# Patient Record
Sex: Male | Born: 1990 | Race: Black or African American | Hispanic: No | Marital: Single | Smoking: Never smoker
Health system: Southern US, Community
[De-identification: ages and names within clinical notes are randomized; demographics above are authoritative.]

## PROBLEM LIST (undated history)

## (undated) HISTORY — PX: KNEE SURGERY: SHX244

---
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
## 2008-05-29 ENCOUNTER — Encounter (INDEPENDENT_AMBULATORY_CARE_PROVIDER_SITE_OTHER): Payer: Self-pay | Admitting: Urology

## 2008-05-29 ENCOUNTER — Other Ambulatory Visit: Payer: Self-pay | Admitting: Emergency Medicine

## 2008-05-29 ENCOUNTER — Ambulatory Visit (HOSPITAL_COMMUNITY): Admission: EM | Admit: 2008-05-29 | Discharge: 2008-05-29 | Payer: Self-pay | Admitting: Emergency Medicine

## 2010-09-03 LAB — CBC
HCT: 41.7 % (ref 39.0–52.0)
MCV: 90.3 fL (ref 78.0–100.0)
WBC: 6.1 10*3/uL (ref 4.0–10.5)

## 2010-09-03 LAB — DIFFERENTIAL
Basophils Relative: 0 % (ref 0–1)
Eosinophils Absolute: 0 10*3/uL (ref 0.0–0.7)
Lymphocytes Relative: 24 % (ref 12–46)
Monocytes Relative: 5 % (ref 3–12)

## 2010-09-03 LAB — BASIC METABOLIC PANEL
BUN: 9 mg/dL (ref 6–23)
Calcium: 9.3 mg/dL (ref 8.4–10.5)
Chloride: 105 mEq/L (ref 96–112)
Creatinine, Ser: 0.8 mg/dL (ref 0.4–1.5)
GFR calc Af Amer: >60 mL/min (ref >60)
Potassium: 4.1 mEq/L (ref 3.5–5.1)
Sodium: 139 mEq/L (ref 135–145)

## 2010-10-02 NOTE — Op Note (Signed)
NAMETERRYN, REDNER              ACCOUNT NO.:  [id number]   MEDICAL RECORD NO.:  [id number]          PATIENT TYPE:  EMS   LOCATION:  ED                           FACILITY:  [facility]   PHYSICIAN:  Courtney Paris, M.D.DATE OF BIRTH:  1990/10/16   DATE OF PROCEDURE:  05/29/2008  DATE OF DISCHARGE:  05/29/2008                               OPERATIVE REPORT   PREOPERATIVE DIAGNOSIS:  Paraphimosis.   POSTOPERATIVE DIAGNOSIS:  Paraphimosis.   OPERATION:  Emergency circumcision.   ANESTHESIA:  General.   SURGEON:  Courtney Paris, M.D.   BRIEF HISTORY:  This 20 year old [employer] freshman was admitted with a 16 or  17-hour nonretractable foreskin.  He has paraphimosis attempted to be  reduced at the [facility] but despite IV Versed, local  Xylocaine, ice and several attempts at manual reduction, this was not  possible.  He was sent for evaluation.  He was seen in emergency room  and taken immediately to surgery where an emergency circumcision was to  be done.   The patient was placed on the operating table in supine position.  After  satisfactory induction of general anesthesia, he was prepped and draped  with Betadine in the usual sterile fashion.  IV Ancef was given.  Time-  out was then performed and the patient and procedure were then  reconfirmed.  Even asleep, I could not manually reduce the paraphimosis.  The skin was beginning to crack where the ring was, that was so tight  causing rather massive edema of the glans penis and the foreskin that  would not retract.  A circumferential incision was made around this and  another one around the coronal sulcus and then the redundant skin was  then removed.  There was still some edematous skin that had to be  clamped and ligated with 4-0 chromic ties to effect good hemostasis.  When this was done, the Bovie with a fine-needle electrode effected good  hemostasis.  The edges of the skin were then reapproximated with  interrupted 4-0 chromic catgut suture.  This was done circumferentially.  7 mL of 0.5% Marcaine was injected around the base of penis for  postoperative pain relief.  The glans looked viable and the circumcision  looked good when we finished.  We put a dressing of collodion, Vaseline  gauze, sterile dry bandage and Coban.  The patient was taken recovery  room in good condition to be later discharged as an outpatient.  Have  him come back to the office in a week for follow-up.      Courtney Paris, M.D.  Electronically Signed     HMK/MEDQ  D:  05/29/2008  T:  05/30/2008  Job:  [id number]

## 2011-05-13 ENCOUNTER — Encounter: Payer: Self-pay | Admitting: *Deleted

## 2011-05-13 ENCOUNTER — Emergency Department (HOSPITAL_BASED_OUTPATIENT_CLINIC_OR_DEPARTMENT_OTHER)
Admission: EM | Admit: 2011-05-13 | Discharge: 2011-05-13 | Disposition: A | Discharge status: Home / Self Care | Payer: BC Managed Care – PPO | Attending: Emergency Medicine | Admitting: Emergency Medicine

## 2011-05-13 DIAGNOSIS — W260XXA Contact with knife, initial encounter: Secondary | ICD-10-CM | POA: Insufficient documentation

## 2011-05-13 DIAGNOSIS — S61219A Laceration without foreign body of unspecified finger without damage to nail, initial encounter: Secondary | ICD-10-CM

## 2011-05-13 DIAGNOSIS — S61209A Unspecified open wound of unspecified finger without damage to nail, initial encounter: Secondary | ICD-10-CM | POA: Insufficient documentation

## 2011-05-13 DIAGNOSIS — W261XXA Contact with sword or dagger, initial encounter: Secondary | ICD-10-CM | POA: Insufficient documentation

## 2011-05-13 NOTE — ED Notes (Signed)
Left index finger laceration. Was putting together a plastic toy and cut his self. Bleeding controlled.

## 2011-05-13 NOTE — ED Provider Notes (Signed)
History   This chart was scribed for Cyndra Numbers, MD by Melba Coon. The patient was seen in room MH01/MH01 and the patient's care was started at 8:15PM.    CSN: [id number]  Arrival date & time 05/13/11  [place]   First MD Initiated Contact with Patient 05/13/11 2015      Chief Complaint  Patient presents with  . Extremity Laceration    (Consider location/radiation/quality/duration/timing/severity/associated sxs/prior treatment) HPI  Johnathan Ramirez is a 20 y.o. male who presents to the Emergency Department complaining of moderate to severe left index finger laceration with an onset an hour and a half ago (6:30 PM). Pt was putting together a plastic toy and using a pocket knife when he cut himself. Pt is right-handed. Has no other health problems.  Last tetanus shot was in 2009.   History reviewed. No pertinent past medical history.  Past Surgical History  Procedure Date  . Knee surgery     History reviewed. No pertinent family history.  History  Substance Use Topics  . Smoking status: Never Smoker   . Smokeless tobacco: Not on file  . Alcohol Use: No      Review of Systems 10 Systems reviewed and are negative for acute change except as noted in the HPI.  Allergies  Review of patient's allergies indicates no known allergies.  Home Medications  No current outpatient prescriptions on file.  BP 138/74  Pulse 84  Temp(Src) 99.2 F (37.3 C) (Oral)  Resp 20  SpO2 100%  Physical Exam  Nursing note and vitals reviewed. Constitutional: He is oriented to person, place, and time. He appears well-developed and well-nourished. No distress.  HENT:  Head: Normocephalic and atraumatic.  Eyes: Conjunctivae and EOM are normal. Pupils are equal, round, and reactive to light.  Neck: Normal range of motion. No tracheal deviation present.  Cardiovascular: Normal rate, regular rhythm and normal heart sounds.   Pulmonary/Chest: Effort normal and breath sounds normal. No  respiratory distress.  Abdominal: Soft. He exhibits no distension.  Musculoskeletal: Normal range of motion. He exhibits no edema.       Small 1.5 cm superficial laceration with minimal linear gaping.  Neurological: He is alert and oriented to person, place, and time. No sensory deficit.  Skin: Skin is warm and dry. No rash noted.  Psychiatric: He has a normal mood and affect. His behavior is normal.    ED Course  LACERATION REPAIR Date/Time: 05/13/2011 8:20 PM Performed by: Cyndra Numbers Authorized by: Cyndra Numbers Consent: Verbal consent obtained. Written consent not obtained. Risks and benefits: risks, benefits and alternatives were discussed Consent given by: patient Patient understanding: patient states understanding of the procedure being performed Patient identity confirmed: verbally with patient Body area: upper extremity Location details: left index finger Laceration length: 1.5 cm Foreign bodies: no foreign bodies Tendon involvement: none Nerve involvement: none Vascular damage: no Patient sedated: no Skin closure: glue Technique: simple Approximation: close Approximation difficulty: simple Patient tolerance: Patient tolerated the procedure well with no immediate complications. Comments: Patient had hemostasis and reasonable cosmesis.  Patient was soaked in betadine prior to procedure.   (including critical care time)  DIAGNOSTIC STUDIES: Oxygen Saturation is 100% on room air, normal by my interpretation.    COORDINATION OF CARE:     Labs Reviewed - No data to display No results found.   1. Laceration of finger of left hand       MDM  Patient was evaluated and had very small laceration. This is very  superficial. Patient was comfortable with plan for Dermabond. Repair was performed. This had good hemostasis. Patient had up-to-date tetanus. He was discharged in good condition.  I personally performed the services described in this documentation, which  was scribed in my presence. The recorded information has been reviewed and considered.         Cyndra Numbers, MD 05/13/11 2213

## 2011-05-14 ENCOUNTER — Emergency Department (HOSPITAL_BASED_OUTPATIENT_CLINIC_OR_DEPARTMENT_OTHER)
Admission: EM | Admit: 2011-05-14 | Discharge: 2011-05-14 | Disposition: A | Discharge status: Home / Self Care | Payer: BC Managed Care – PPO | Attending: Emergency Medicine | Admitting: Emergency Medicine

## 2011-05-14 ENCOUNTER — Encounter (HOSPITAL_BASED_OUTPATIENT_CLINIC_OR_DEPARTMENT_OTHER): Payer: Self-pay | Admitting: *Deleted

## 2011-05-14 DIAGNOSIS — S61219A Laceration without foreign body of unspecified finger without damage to nail, initial encounter: Secondary | ICD-10-CM

## 2011-05-14 DIAGNOSIS — T8133XA Disruption of traumatic injury wound repair, initial encounter: Secondary | ICD-10-CM | POA: Insufficient documentation

## 2011-05-14 DIAGNOSIS — Y849 Medical procedure, unspecified as the cause of abnormal reaction of the patient, or of later complication, without mention of misadventure at the time of the procedure: Secondary | ICD-10-CM | POA: Insufficient documentation

## 2011-05-14 NOTE — ED Notes (Signed)
Pt was seen here earlier last Pm for a lac to left first digit lac was glued and has split open

## 2011-05-14 NOTE — ED Notes (Signed)
Aluminum splint applied to finger.

## 2011-05-14 NOTE — ED Provider Notes (Signed)
History     CSN: [id number]  Arrival date & time 05/14/11  0102   First MD Initiated Contact with Patient 05/14/11 0139      Chief Complaint  Patient presents with  . Wound Dehiscence    (Consider location/radiation/quality/duration/timing/severity/associated sxs/prior treatment) HPI Comments: Patient was seen in this emergency department yesterday at approximately 8 PM per his report.  Patient had a laceration to his left dorsal index finger which was initially repaired with Dermabond.  Patient had been at home and the glue has come off and so is coming to the wound is still open.  His tetanus shot is up-to-date.  The initial wound occurred due to a pocket knife when he was trying to open a toy.  Patient is a 20 y.o. male presenting with wound check. The history is provided by the patient.  Wound Check     History reviewed. No pertinent past medical history.  Past Surgical History  Procedure Date  . Knee surgery     History reviewed. No pertinent family history.  History  Substance Use Topics  . Smoking status: Never Smoker   . Smokeless tobacco: Not on file  . Alcohol Use: No      Review of Systems  All other systems reviewed and are negative.    Allergies  Review of patient's allergies indicates no known allergies.  Home Medications  No current outpatient prescriptions on file.  BP 132/71  Pulse 69  Temp 98.4 F (36.9 C)  Resp 16  SpO2 99%  Physical Exam  Constitutional: He is oriented to person, place, and time. He appears well-developed and well-nourished.  HENT:  Head: Normocephalic and atraumatic.  Eyes: Conjunctivae and EOM are normal. Pupils are equal, round, and reactive to light.  Neck: Normal range of motion. Neck supple.  Pulmonary/Chest: Effort normal.  Musculoskeletal: Normal range of motion. He exhibits no edema and no tenderness.  Neurological: He is alert and oriented to person, place, and time.  Skin: Skin is warm and dry. No rash  noted. No erythema.       Left dorsal index finger on the proximal phalanx has a 2 cm linear laceration that does have a skin flap.  Psychiatric: He has a normal mood and affect. His behavior is normal. Judgment and thought content normal.    ED Course  Procedures (including critical care time)  Labs Reviewed - No data to display No results found.   No diagnosis found.    MDM  Patient had the wound reapproximated with Steri-Strips here.  Wound otherwise it clean and intact.  At this point in time given the wound occurred approximately 6 hours ago this will insure that the wound is now become infected and will reapproximate the wound appropriately.  Patient desires a aluminum splint to protect the finger and understands that he can wear that as needed.  Patient be discharged home to        Nat Christen, MD 05/14/11 [phone]

## 2020-11-23 ENCOUNTER — Other Ambulatory Visit: Payer: Self-pay

## 2020-11-23 ENCOUNTER — Encounter (HOSPITAL_BASED_OUTPATIENT_CLINIC_OR_DEPARTMENT_OTHER): Payer: Self-pay | Admitting: *Deleted

## 2020-11-23 ENCOUNTER — Emergency Department (HOSPITAL_BASED_OUTPATIENT_CLINIC_OR_DEPARTMENT_OTHER)
Admission: EM | Admit: 2020-11-23 | Discharge: 2020-11-23 | Disposition: A | Discharge status: Home / Self Care | Payer: 59 | Attending: Emergency Medicine | Admitting: Emergency Medicine

## 2020-11-23 DIAGNOSIS — Z23 Encounter for immunization: Secondary | ICD-10-CM | POA: Diagnosis not present

## 2020-11-23 DIAGNOSIS — S61412A Laceration without foreign body of left hand, initial encounter: Secondary | ICD-10-CM | POA: Diagnosis not present

## 2020-11-23 DIAGNOSIS — S60922A Unspecified superficial injury of left hand, initial encounter: Secondary | ICD-10-CM | POA: Diagnosis present

## 2020-11-23 DIAGNOSIS — W278XXA Contact with other nonpowered hand tool, initial encounter: Secondary | ICD-10-CM | POA: Insufficient documentation

## 2020-11-23 DIAGNOSIS — S61219A Laceration without foreign body of unspecified finger without damage to nail, initial encounter: Secondary | ICD-10-CM

## 2020-11-23 MED ORDER — TETANUS-DIPHTH-ACELL PERTUSSIS 5-2.5-18.5 LF-MCG/0.5 IM SUSY
0.5000 mL | PREFILLED_SYRINGE | Freq: Once | INTRAMUSCULAR | Status: AC
  Administered 2020-11-23: 0.5 mL via INTRAMUSCULAR
  Filled 2020-11-23: qty 0.5

## 2020-11-23 MED ORDER — LIDOCAINE HCL (PF) 1 % IJ SOLN
10.0000 mL | Freq: Once | INTRAMUSCULAR | Status: AC
  Administered 2020-11-23: 10 mL
  Filled 2020-11-23: qty 10

## 2020-11-23 NOTE — Discharge Instructions (Addendum)
Suture removal in 8-10 days °

## 2020-11-23 NOTE — ED Provider Notes (Signed)
[facility] EMERGENCY DEPARTMENT Provider Note   CSN: [id number] Arrival date & time: 11/23/20  1659     History Chief Complaint  Patient presents with   Laceration    Johnathan Ramirez is a 30 y.o. male.  The history is provided by the patient. No language interpreter was used.  Laceration Location:  Hand Hand laceration location:  L hand Length:  3.5 Depth:  Cutaneous Quality: straight   Time since incident:  1 hour Laceration mechanism:  Unable to specify Pain details:    Quality:  Aching   Severity:  No pain   Timing:  Constant Relieved by:  Nothing Worsened by:  Nothing Ineffective treatments:  None tried Associated symptoms: no fever       History reviewed. No pertinent past medical history.  There are no problems to display for this patient.   Past Surgical History:  Procedure Laterality Date   KNEE SURGERY         No family history on file.  Social History   Tobacco Use   Smoking status: Never   Smokeless tobacco: Never  Vaping Use   Vaping Use: Never used  Substance Use Topics   Alcohol use: No   Drug use: No    Home Medications Prior to Admission medications   Not on File    Allergies    Patient has no known allergies.  Review of Systems   Review of Systems  Constitutional:  Negative for fever.  All other systems reviewed and are negative.  Physical Exam Updated Vital Signs BP 130/81 (BP Location: Right Arm)   Pulse (!) 106   Temp 98.4 F (36.9 C) (Oral)   Resp 18   Ht 6\' 2"  (1.88 m)   Wt 107.3 kg   SpO2 100%   BMI 30.36 kg/m   Physical Exam Vitals reviewed.  Musculoskeletal:     Comments: 3.5 cm laceration left hand gapping  from  nv and ns intact   Skin:    General: Skin is warm.  Neurological:     General: No focal deficit present.     Mental Status: He is alert.  Psychiatric:        Mood and Affect: Mood normal.    ED Results / Procedures / Treatments   Labs (all labs ordered are listed, but  only abnormal results are displayed) Labs Reviewed - No data to display  EKG None  Radiology No results found.  Procedures . Laceration Repair  Date/Time: 11/24/2020 12:51 PM Performed by: 01/25/2021, PA-C Authorized by: Elson Areas, PA-C   Consent:    Consent obtained:  Verbal   Consent given by:  Patient   Risks, benefits, and alternatives were discussed: yes     Risks discussed:  Infection   Alternatives discussed:  Delayed treatment Universal protocol:    Procedure explained and questions answered to patient or proxy's satisfaction: yes     Relevant documents present and verified: yes     Site/side marked: yes     Immediately prior to procedure, a time out was called: yes     Patient identity confirmed:  Verbally with patient Anesthesia:    Anesthesia method:  Local infiltration   Local anesthetic:  Lidocaine 1% w/o epi Laceration details:    Location:  Hand   Hand location:  L palm Pre-procedure details:    Preparation:  Patient was prepped and draped in usual sterile fashion Exploration:    Limited defect created (wound extended): no  Contaminated: no   Treatment:    Area cleansed with:  Povidone-iodine   Amount of cleaning:  Standard   Debridement:  None   Undermining:  None Skin repair:    Repair method:  Sutures   Suture size:  5-0   Suture material:  Prolene   Suture technique:  Simple interrupted   Number of sutures:  10 Approximation:    Approximation:  Loose Repair type:    Repair type:  Intermediate Post-procedure details:    Dressing:  Antibiotic ointment   Procedure completion:  Tolerated   Medications Ordered in ED Medications - No data to display  ED Course  I have reviewed the triage vital signs and the nursing notes.  Pertinent labs & imaging results that were available during my care of the patient were reviewed by me and considered in my medical decision making (see chart for details).    MDM Rules/Calculators/A&P                           MDM:  Pt advised to watch for infection.  Suture removal in 8-10 days  Final Clinical Impression(s) / ED Diagnoses Final diagnoses:  Laceration of finger of left hand without foreign body without damage to nail, unspecified finger, initial encounter    Rx / DC Orders ED Discharge Orders     None     An After Visit Summary was printed and given to the patient.    Osie Cheeks 11/24/20 1253    Benjiman Core, MD 11/24/20 1453

## 2020-11-23 NOTE — ED Triage Notes (Signed)
Laceration to his left hand with a chisel. Bleeding controlled.

## 2022-01-08 DIAGNOSIS — F902 Attention-deficit hyperactivity disorder, combined type: Secondary | ICD-10-CM | POA: Diagnosis not present

## 2022-02-27 DIAGNOSIS — F902 Attention-deficit hyperactivity disorder, combined type: Secondary | ICD-10-CM | POA: Diagnosis not present

## 2022-03-14 DIAGNOSIS — F411 Generalized anxiety disorder: Secondary | ICD-10-CM | POA: Diagnosis not present

## 2022-03-25 DIAGNOSIS — F411 Generalized anxiety disorder: Secondary | ICD-10-CM | POA: Diagnosis not present

## 2022-04-08 DIAGNOSIS — Z630 Problems in relationship with spouse or partner: Secondary | ICD-10-CM | POA: Diagnosis not present

## 2022-04-08 DIAGNOSIS — F411 Generalized anxiety disorder: Secondary | ICD-10-CM | POA: Diagnosis not present

## 2022-04-23 DIAGNOSIS — F411 Generalized anxiety disorder: Secondary | ICD-10-CM | POA: Diagnosis not present

## 2022-04-23 DIAGNOSIS — Z630 Problems in relationship with spouse or partner: Secondary | ICD-10-CM | POA: Diagnosis not present

## 2022-05-07 DIAGNOSIS — F411 Generalized anxiety disorder: Secondary | ICD-10-CM | POA: Diagnosis not present

## 2022-05-07 DIAGNOSIS — Z630 Problems in relationship with spouse or partner: Secondary | ICD-10-CM | POA: Diagnosis not present

## 2022-05-31 DIAGNOSIS — Z630 Problems in relationship with spouse or partner: Secondary | ICD-10-CM | POA: Diagnosis not present

## 2022-05-31 DIAGNOSIS — F411 Generalized anxiety disorder: Secondary | ICD-10-CM | POA: Diagnosis not present

## 2022-06-04 DIAGNOSIS — F902 Attention-deficit hyperactivity disorder, combined type: Secondary | ICD-10-CM | POA: Diagnosis not present

## 2022-07-02 DIAGNOSIS — F902 Attention-deficit hyperactivity disorder, combined type: Secondary | ICD-10-CM | POA: Diagnosis not present

## 2022-07-09 DIAGNOSIS — Z630 Problems in relationship with spouse or partner: Secondary | ICD-10-CM | POA: Diagnosis not present

## 2022-07-09 DIAGNOSIS — F411 Generalized anxiety disorder: Secondary | ICD-10-CM | POA: Diagnosis not present

## 2022-07-30 DIAGNOSIS — F411 Generalized anxiety disorder: Secondary | ICD-10-CM | POA: Diagnosis not present

## 2022-08-13 DIAGNOSIS — F411 Generalized anxiety disorder: Secondary | ICD-10-CM | POA: Diagnosis not present

## 2022-08-27 DIAGNOSIS — F411 Generalized anxiety disorder: Secondary | ICD-10-CM | POA: Diagnosis not present

## 2022-09-17 DIAGNOSIS — F411 Generalized anxiety disorder: Secondary | ICD-10-CM | POA: Diagnosis not present

## 2022-10-01 DIAGNOSIS — F411 Generalized anxiety disorder: Secondary | ICD-10-CM | POA: Diagnosis not present

## 2022-10-09 DIAGNOSIS — F902 Attention-deficit hyperactivity disorder, combined type: Secondary | ICD-10-CM | POA: Diagnosis not present

## 2022-10-15 DIAGNOSIS — F411 Generalized anxiety disorder: Secondary | ICD-10-CM | POA: Diagnosis not present

## 2022-10-29 DIAGNOSIS — F411 Generalized anxiety disorder: Secondary | ICD-10-CM | POA: Diagnosis not present

## 2022-11-12 DIAGNOSIS — F411 Generalized anxiety disorder: Secondary | ICD-10-CM | POA: Diagnosis not present

## 2022-11-20 DIAGNOSIS — F411 Generalized anxiety disorder: Secondary | ICD-10-CM | POA: Diagnosis not present

## 2022-11-26 DIAGNOSIS — F411 Generalized anxiety disorder: Secondary | ICD-10-CM | POA: Diagnosis not present

## 2022-12-04 DIAGNOSIS — F411 Generalized anxiety disorder: Secondary | ICD-10-CM | POA: Diagnosis not present

## 2022-12-17 DIAGNOSIS — F411 Generalized anxiety disorder: Secondary | ICD-10-CM | POA: Diagnosis not present

## 2022-12-24 DIAGNOSIS — F411 Generalized anxiety disorder: Secondary | ICD-10-CM | POA: Diagnosis not present

## 2023-01-06 DIAGNOSIS — F902 Attention-deficit hyperactivity disorder, combined type: Secondary | ICD-10-CM | POA: Diagnosis not present

## 2023-01-07 DIAGNOSIS — F411 Generalized anxiety disorder: Secondary | ICD-10-CM | POA: Diagnosis not present

## 2023-01-21 DIAGNOSIS — F411 Generalized anxiety disorder: Secondary | ICD-10-CM | POA: Diagnosis not present

## 2023-01-28 DIAGNOSIS — F411 Generalized anxiety disorder: Secondary | ICD-10-CM | POA: Diagnosis not present

## 2023-02-11 DIAGNOSIS — F411 Generalized anxiety disorder: Secondary | ICD-10-CM | POA: Diagnosis not present

## 2023-02-18 DIAGNOSIS — F411 Generalized anxiety disorder: Secondary | ICD-10-CM | POA: Diagnosis not present

## 2023-02-25 DIAGNOSIS — F411 Generalized anxiety disorder: Secondary | ICD-10-CM | POA: Diagnosis not present

## 2023-03-04 DIAGNOSIS — F411 Generalized anxiety disorder: Secondary | ICD-10-CM | POA: Diagnosis not present

## 2023-03-11 DIAGNOSIS — F411 Generalized anxiety disorder: Secondary | ICD-10-CM | POA: Diagnosis not present

## 2023-04-01 DIAGNOSIS — F411 Generalized anxiety disorder: Secondary | ICD-10-CM | POA: Diagnosis not present

## 2023-04-15 DIAGNOSIS — F411 Generalized anxiety disorder: Secondary | ICD-10-CM | POA: Diagnosis not present

## 2023-04-29 DIAGNOSIS — F411 Generalized anxiety disorder: Secondary | ICD-10-CM | POA: Diagnosis not present

## 2023-04-30 DIAGNOSIS — F902 Attention-deficit hyperactivity disorder, combined type: Secondary | ICD-10-CM | POA: Diagnosis not present

## 2023-05-13 DIAGNOSIS — F411 Generalized anxiety disorder: Secondary | ICD-10-CM | POA: Diagnosis not present

## 2023-05-27 DIAGNOSIS — F411 Generalized anxiety disorder: Secondary | ICD-10-CM | POA: Diagnosis not present

## 2023-07-08 DIAGNOSIS — F411 Generalized anxiety disorder: Secondary | ICD-10-CM | POA: Diagnosis not present

## 2023-07-22 DIAGNOSIS — F411 Generalized anxiety disorder: Secondary | ICD-10-CM | POA: Diagnosis not present

## 2023-08-05 DIAGNOSIS — F411 Generalized anxiety disorder: Secondary | ICD-10-CM | POA: Diagnosis not present

## 2023-08-06 DIAGNOSIS — F902 Attention-deficit hyperactivity disorder, combined type: Secondary | ICD-10-CM | POA: Diagnosis not present

## 2023-08-19 DIAGNOSIS — F411 Generalized anxiety disorder: Secondary | ICD-10-CM | POA: Diagnosis not present

## 2023-09-02 DIAGNOSIS — F411 Generalized anxiety disorder: Secondary | ICD-10-CM | POA: Diagnosis not present

## 2023-09-16 DIAGNOSIS — F411 Generalized anxiety disorder: Secondary | ICD-10-CM | POA: Diagnosis not present

## 2023-09-30 DIAGNOSIS — F411 Generalized anxiety disorder: Secondary | ICD-10-CM | POA: Diagnosis not present

## 2023-10-14 DIAGNOSIS — F411 Generalized anxiety disorder: Secondary | ICD-10-CM | POA: Diagnosis not present

## 2023-10-23 DIAGNOSIS — F902 Attention-deficit hyperactivity disorder, combined type: Secondary | ICD-10-CM | POA: Diagnosis not present

## 2023-10-28 DIAGNOSIS — F411 Generalized anxiety disorder: Secondary | ICD-10-CM | POA: Diagnosis not present

## 2023-11-10 DIAGNOSIS — R030 Elevated blood-pressure reading, without diagnosis of hypertension: Secondary | ICD-10-CM | POA: Diagnosis not present

## 2023-11-11 DIAGNOSIS — F411 Generalized anxiety disorder: Secondary | ICD-10-CM | POA: Diagnosis not present

## 2023-11-25 DIAGNOSIS — F411 Generalized anxiety disorder: Secondary | ICD-10-CM | POA: Diagnosis not present

## 2023-11-26 DIAGNOSIS — F902 Attention-deficit hyperactivity disorder, combined type: Secondary | ICD-10-CM | POA: Diagnosis not present

## 2023-12-01 DIAGNOSIS — M25561 Pain in right knee: Secondary | ICD-10-CM | POA: Diagnosis not present

## 2024-01-14 DIAGNOSIS — M25561 Pain in right knee: Secondary | ICD-10-CM | POA: Diagnosis not present

## 2024-02-03 DIAGNOSIS — F411 Generalized anxiety disorder: Secondary | ICD-10-CM | POA: Diagnosis not present

## 2024-03-02 DIAGNOSIS — F411 Generalized anxiety disorder: Secondary | ICD-10-CM | POA: Diagnosis not present

## 2024-03-10 DIAGNOSIS — F902 Attention-deficit hyperactivity disorder, combined type: Secondary | ICD-10-CM | POA: Diagnosis not present

## 2024-04-13 DIAGNOSIS — F411 Generalized anxiety disorder: Secondary | ICD-10-CM | POA: Diagnosis not present

## 2024-04-27 DIAGNOSIS — F411 Generalized anxiety disorder: Secondary | ICD-10-CM | POA: Diagnosis not present

## 2024-05-11 DIAGNOSIS — F411 Generalized anxiety disorder: Secondary | ICD-10-CM | POA: Diagnosis not present
